# Patient Record
Sex: Male | Born: 2000 | Race: White | Hispanic: No | Marital: Single | Smoking: Never smoker
Health system: Southern US, Community
[De-identification: ages and names within clinical notes are randomized; demographics above are authoritative.]

## PROBLEM LIST (undated history)

## (undated) DIAGNOSIS — J45909 Unspecified asthma, uncomplicated: Secondary | ICD-10-CM

## (undated) HISTORY — DX: Unspecified asthma, uncomplicated: J45.909

---
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
## 2018-03-11 ENCOUNTER — Ambulatory Visit: Payer: Self-pay | Admitting: Family Medicine

## 2018-03-11 VITALS — BP 146/82 | Temp 98.0°F | Ht 71.0 in | Wt 288.0 lb

## 2018-03-11 DIAGNOSIS — T148XXA Other injury of unspecified body region, initial encounter: Secondary | ICD-10-CM

## 2018-03-11 DIAGNOSIS — S81812A Laceration without foreign body, left lower leg, initial encounter: Secondary | ICD-10-CM

## 2018-03-11 DIAGNOSIS — Z23 Encounter for immunization: Secondary | ICD-10-CM

## 2018-03-11 DIAGNOSIS — M79662 Pain in left lower leg: Secondary | ICD-10-CM

## 2018-03-11 NOTE — Progress Notes (Signed)
   Subjective:    Patient ID: Kenneth Campos, male    DOB: May 24, 2001, 17 y.o.   MRN: [id number]  HPI  Patient is here today with a cut he received yesterday afternoon done on a piece metal.Wrapped and cleaned with iodine.  Patient experienced shallow laceration of the leg.  Father patient elected not to take him to the emergency room butterfly displayed.  18 hours ago.  Photograph shows shallow laceration  Review of Systems No headache, no major weight loss or weight gain, no chest pain no back pain abdominal pain no change in bowel habits complete ROS otherwise negative     Objective:   Physical Exam   Alert vitals stable, NAD. Blood pressure good on repeat. HEENT normal. Lungs clear. Heart regular rate and rhythm. Anterior left leg laceration with Steri-Strips some surrounding hematoma leftover from old injury     Assessment & Plan:  Impression soft tissue wound plan wound care discussed see wrap-up patient instructions.  Tetanus shot rationale discussed

## 2018-03-11 NOTE — Patient Instructions (Signed)
lacereation to the leg that is this old (18 hrs ish the experts recommend not to put stitches in becsuse the risk of secondary infection and failure of the suture line is very high)  So, recommend leaving these butterfly strips on until they start to fray, try to keep dry, when start to fray recommend using rubing alcohol and replace for another few days, do this for ten days total to increase the chance of wound healing nicely  There is a chance this will open up at one point, if that does, tret like a ulcer on the leg, with twice daily cleaning, and triple antibiotic ointment or neopsorin, and , and it will eventually fill in with a little scarring                                                                      Finger to take also a Z-Pak with only for brevity sake start showing signs of infection to these 6 weeks

## 2018-10-17 ENCOUNTER — Other Ambulatory Visit: Payer: Self-pay

## 2018-10-17 ENCOUNTER — Emergency Department (HOSPITAL_COMMUNITY)
Admission: EM | Admit: 2018-10-17 | Discharge: 2018-10-17 | Disposition: A | Discharge status: Home / Self Care | Payer: Self-pay | Attending: Emergency Medicine | Admitting: Emergency Medicine

## 2018-10-17 ENCOUNTER — Encounter (HOSPITAL_COMMUNITY): Payer: Self-pay | Admitting: Emergency Medicine

## 2018-10-17 DIAGNOSIS — R69 Illness, unspecified: Secondary | ICD-10-CM

## 2018-10-17 DIAGNOSIS — Z79899 Other long term (current) drug therapy: Secondary | ICD-10-CM | POA: Insufficient documentation

## 2018-10-17 DIAGNOSIS — J45909 Unspecified asthma, uncomplicated: Secondary | ICD-10-CM | POA: Insufficient documentation

## 2018-10-17 DIAGNOSIS — J111 Influenza due to unidentified influenza virus with other respiratory manifestations: Secondary | ICD-10-CM | POA: Insufficient documentation

## 2018-10-17 MED ORDER — ACETAMINOPHEN 500 MG PO TABS
1000.0000 mg | ORAL_TABLET | Freq: Once | ORAL | Status: AC
  Administered 2018-10-17: 1000 mg via ORAL
  Filled 2018-10-17: qty 2

## 2018-10-17 MED ORDER — ONDANSETRON HCL 4 MG PO TABS
4.0000 mg | ORAL_TABLET | Freq: Once | ORAL | Status: AC
  Administered 2018-10-17: 4 mg via ORAL
  Filled 2018-10-17: qty 1

## 2018-10-17 MED ORDER — ONDANSETRON HCL 4 MG PO TABS: 4.0000 mg | ORAL_TABLET | Freq: Four times a day (QID) | ORAL | 0 refills | Status: DC

## 2018-10-17 NOTE — ED Triage Notes (Signed)
Flu like sx   No vaccination   Here for eval

## 2018-10-17 NOTE — ED Provider Notes (Signed)
[facility] EMERGENCY DEPARTMENT Provider Note   CSN: [id number] Arrival date & time: 10/17/18  1459    History   Chief Complaint Chief Complaint  Patient presents with  . Influenza    HPI Kenneth Campos is a 18 y.o. male.     The history is provided by the patient.  Influenza  Presenting symptoms: cough, fatigue, fever, myalgias and vomiting   Presenting symptoms: no shortness of breath   Severity:  Moderate Duration:  2 days Progression:  Worsening Chronicity:  New Relieved by:  Nothing Worsened by:  Nothing Ineffective treatments:  OTC medications Associated symptoms: decreased appetite and nasal congestion   Risk factors: sick contacts     Past Medical History:  Diagnosis Date  . Asthma     There are no active problems to display for this patient.   History reviewed. No pertinent surgical history.      Home Medications    Prior to Admission medications   Medication Sig Start Date End Date Taking? Authorizing Provider  cephALEXin (KEFLEX) 500 MG capsule Take 1 capsule (500 mg total) by mouth 3 (three) times daily. Patient not taking: Reported on 03/11/2018 11/19/16   Merlyn Albert, MD    Family History No family history on file.  Social History Social History   Tobacco Use  . Smoking status: Never Smoker  . Smokeless tobacco: Never Used  Substance Use Topics  . Alcohol use: Not Currently  . Drug use: Never     Allergies   Patient has no known allergies.   Review of Systems Review of Systems  Constitutional: Positive for decreased appetite, fatigue and fever. Negative for activity change.       All ROS Neg except as noted in HPI  HENT: Positive for congestion. Negative for nosebleeds.   Eyes: Negative for photophobia and discharge.  Respiratory: Positive for cough. Negative for shortness of breath and wheezing.   Cardiovascular: Negative for chest pain and palpitations.  Gastrointestinal: Positive for vomiting. Negative for abdominal  pain and blood in stool.  Genitourinary: Negative for dysuria, frequency and hematuria.  Musculoskeletal: Positive for myalgias. Negative for arthralgias, back pain and neck pain.  Skin: Negative.   Neurological: Negative for dizziness, seizures and speech difficulty.  Psychiatric/Behavioral: Negative for confusion and hallucinations.     Physical Exam Updated Vital Signs BP (!) 142/83 (BP Location: Right Arm)   Pulse 99   Temp 98.5 F (36.9 C) (Oral)   Resp 16   Ht 6' (1.829 m)   Wt 117.9 kg   SpO2 99%   BMI 35.26 kg/m   Physical Exam Vitals signs and nursing note reviewed.  Constitutional:      Appearance: He is well-developed. He is not toxic-appearing.  HENT:     Head: Normocephalic.     Right Ear: Tympanic membrane and external ear normal.     Left Ear: Tympanic membrane and external ear normal.     Nose: Congestion present.  Eyes:     General: Lids are normal.     Pupils: Pupils are equal, round, and reactive to light.  Neck:     Musculoskeletal: Normal range of motion and neck supple.     Vascular: No carotid bruit.  Cardiovascular:     Rate and Rhythm: Normal rate and regular rhythm.     Pulses: Normal pulses.     Heart sounds: Normal heart sounds.  Pulmonary:     Effort: No respiratory distress.     Breath sounds: Normal  breath sounds.  Abdominal:     General: Bowel sounds are normal.     Palpations: Abdomen is soft.     Tenderness: There is no abdominal tenderness. There is no guarding.  Musculoskeletal: Normal range of motion.  Lymphadenopathy:     Head:     Right side of head: No submandibular adenopathy.     Left side of head: No submandibular adenopathy.     Cervical: No cervical adenopathy.  Skin:    General: Skin is warm and dry.  Neurological:     Mental Status: He is alert and oriented to person, place, and time.     Cranial Nerves: No cranial nerve deficit.     Sensory: No sensory deficit.  Psychiatric:        Speech: Speech normal.       ED Treatments / Results  Labs (all labs ordered are listed, but only abnormal results are displayed) Labs Reviewed - No data to display  EKG None  Radiology No results found.  Procedures Procedures (including critical care time)  Medications Ordered in ED Medications - No data to display   Initial Impression / Assessment and Plan / ED Course  I have reviewed the triage vital signs and the nursing notes.  Pertinent labs & imaging results that were available during my care of the patient were reviewed by me and considered in my medical decision making (see chart for details).          Final Clinical Impressions(s) / ED Diagnoses MDM  Vital signs reviewed.  Pulse oximetry is 99% on room air.  Within normal limits by my interpretation.  The examination favor influenza-like illness.  The patient states he has not been traveling on commercial transportation recently.  He has not been out of the country recently.  A mask is been provided for the patient.  The patient is to increase fluids.  He will use Tylenol every 4 hours or ibuprofen every 6 hours for fever, and/or aching.  The patient is given a prescription for Zofran for nausea.  He is to see the primary physician or return to the emergency department if any changes in his condition, worsening of symptoms, problems, or concerns.   Final diagnoses:  Influenza-like illness    ED Discharge Orders         Ordered    ondansetron (ZOFRAN) 4 MG tablet  Every 6 hours     10/17/18 1623           Ivery Quale, PA-C 10/17/18 1628    Samuel Jester, DO 10/17/18 2318

## 2018-10-17 NOTE — Discharge Instructions (Addendum)
Your blood pressure slightly elevated, otherwise your vital signs are within normal limits.  Your oxygen level is 99% on room air, which is within normal limits.  Your examination favors an influenza-like illness.  Please use Tylenol every 4 hours or ibuprofen every 6 hours for fever, aching, or chills.  Please use Zofran every 6 hours for nausea and vomiting.  It is important that you wash your hands frequently.  Please have everyone in your home wash hands frequently.  Please wipe off surfaces as needed.  Please increase fluids.  See Dr. Gerda Diss or return to the emergency department if any changes in your condition, worsening of your symptoms, problems, or concerns.

## 2019-06-05 ENCOUNTER — Other Ambulatory Visit: Payer: Self-pay

## 2019-06-05 DIAGNOSIS — Z20822 Contact with and (suspected) exposure to covid-19: Secondary | ICD-10-CM

## 2019-06-06 LAB — NOVEL CORONAVIRUS, NAA: SARS-CoV-2, NAA: NOT DETECTED

## 2019-06-11 ENCOUNTER — Telehealth: Payer: Self-pay | Admitting: *Deleted

## 2019-06-11 NOTE — Telephone Encounter (Signed)
He called in for COVID-19 test result.   I let him know it was not detected meaning he did not have the virus.

## 2019-08-03 ENCOUNTER — Other Ambulatory Visit: Payer: Self-pay

## 2019-08-03 ENCOUNTER — Ambulatory Visit: Payer: HRSA Program | Attending: Internal Medicine

## 2019-08-03 DIAGNOSIS — Z20822 Contact with and (suspected) exposure to covid-19: Secondary | ICD-10-CM

## 2019-08-03 DIAGNOSIS — Z20828 Contact with and (suspected) exposure to other viral communicable diseases: Secondary | ICD-10-CM | POA: Diagnosis present

## 2019-08-04 ENCOUNTER — Telehealth: Payer: Self-pay

## 2019-08-04 NOTE — Telephone Encounter (Signed)
Received call from patient checking Covid results.  Advised no results at this time.   

## 2019-08-05 ENCOUNTER — Telehealth: Payer: Self-pay | Admitting: Family Medicine

## 2019-08-05 LAB — NOVEL CORONAVIRUS, NAA: SARS-CoV-2, NAA: NOT DETECTED

## 2019-08-05 NOTE — Telephone Encounter (Signed)
Negative COVID results given. Patient results "NOT Detected." Caller expressed understanding. ° °

## 2019-08-11 ENCOUNTER — Other Ambulatory Visit: Payer: Self-pay

## 2019-08-11 ENCOUNTER — Ambulatory Visit: Payer: Self-pay | Attending: Internal Medicine

## 2019-08-11 DIAGNOSIS — Z20822 Contact with and (suspected) exposure to covid-19: Secondary | ICD-10-CM | POA: Insufficient documentation

## 2019-08-13 ENCOUNTER — Telehealth: Payer: Self-pay | Admitting: *Deleted

## 2019-08-13 LAB — NOVEL CORONAVIRUS, NAA: SARS-CoV-2, NAA: NOT DETECTED

## 2019-08-13 NOTE — Telephone Encounter (Signed)
Pt advised that his covid 19 test result is negative from Jan  5 th. He voiced understanding.

## 2019-09-02 ENCOUNTER — Encounter: Payer: Self-pay | Admitting: Family Medicine

## 2019-09-03 ENCOUNTER — Encounter: Payer: Self-pay | Admitting: Family Medicine

## 2020-08-11 ENCOUNTER — Other Ambulatory Visit: Payer: Self-pay

## 2020-08-11 DIAGNOSIS — Z20822 Contact with and (suspected) exposure to covid-19: Secondary | ICD-10-CM

## 2020-08-15 LAB — NOVEL CORONAVIRUS, NAA: SARS-CoV-2, NAA: NOT DETECTED

## 2020-08-15 LAB — SPECIMEN STATUS REPORT

## 2023-04-03 ENCOUNTER — Ambulatory Visit (INDEPENDENT_AMBULATORY_CARE_PROVIDER_SITE_OTHER): Payer: Self-pay | Admitting: Orthopedic Surgery

## 2023-04-03 ENCOUNTER — Encounter: Payer: Self-pay | Admitting: Orthopedic Surgery

## 2023-04-03 VITALS — BP 149/85 | HR 96 | Ht 73.0 in | Wt 274.0 lb

## 2023-04-03 DIAGNOSIS — D169 Benign neoplasm of bone and articular cartilage, unspecified: Secondary | ICD-10-CM

## 2023-04-03 DIAGNOSIS — S82831A Other fracture of upper and lower end of right fibula, initial encounter for closed fracture: Secondary | ICD-10-CM

## 2023-04-03 DIAGNOSIS — R202 Paresthesia of skin: Secondary | ICD-10-CM

## 2023-04-03 DIAGNOSIS — M25561 Pain in right knee: Secondary | ICD-10-CM

## 2023-04-03 DIAGNOSIS — M25461 Effusion, right knee: Secondary | ICD-10-CM

## 2023-04-03 MED ORDER — IBUPROFEN 800 MG PO TABS: 800.0000 mg | ORAL_TABLET | Freq: Three times a day (TID) | ORAL | 1 refills | Status: AC | PRN

## 2023-04-03 NOTE — Patient Instructions (Signed)
For MRI please go ahead and call to schedule your appointment with [facility] within at least one (1) week.   Central Scheduling [phone]
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
## 2023-04-11 ENCOUNTER — Ambulatory Visit (HOSPITAL_COMMUNITY)
Admission: RE | Admit: 2023-04-11 | Discharge: 2023-04-11 | Disposition: A | Discharge status: Home / Self Care | Payer: Self-pay | Source: Ambulatory Visit | Attending: Orthopedic Surgery | Admitting: Orthopedic Surgery

## 2023-04-11 DIAGNOSIS — M25561 Pain in right knee: Secondary | ICD-10-CM | POA: Insufficient documentation

## 2023-04-29 ENCOUNTER — Encounter: Payer: Self-pay | Admitting: Orthopedic Surgery

## 2023-04-29 ENCOUNTER — Other Ambulatory Visit (INDEPENDENT_AMBULATORY_CARE_PROVIDER_SITE_OTHER): Payer: Self-pay

## 2023-04-29 ENCOUNTER — Telehealth: Payer: Self-pay | Admitting: Orthopedic Surgery

## 2023-04-29 ENCOUNTER — Ambulatory Visit (INDEPENDENT_AMBULATORY_CARE_PROVIDER_SITE_OTHER): Payer: Self-pay | Admitting: Orthopedic Surgery

## 2023-04-29 VITALS — BP 135/79 | HR 84 | Ht 73.0 in | Wt 279.0 lb

## 2023-04-29 DIAGNOSIS — G8929 Other chronic pain: Secondary | ICD-10-CM

## 2023-04-29 DIAGNOSIS — M25561 Pain in right knee: Secondary | ICD-10-CM

## 2023-04-29 DIAGNOSIS — M25861 Other specified joint disorders, right knee: Secondary | ICD-10-CM

## 2023-04-29 NOTE — Telephone Encounter (Signed)
The study was done on 04/11/23 it resulted today  MPRESSION: 1. 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 This is new from 03/02/2016 radiographs. 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 Unfortunately, no recent radiographs are available for comparison, and this was not present on 03/02/2016 left knee radiographs. Recommend correlation with current radiographs. As this process is incompletely visualized on the current MRI, consider complete visualization on radiographs and possible biopsy/resection of this large posterior proximal calf ossicle for further diagnosis. 2. There is moderate patchy marrow edema throughout the posterior proximal tibial epiphysis and physis, presumably stress related/reactive from impingement/abutment with the large posterior proximal calf ossicle. 3. There is moderate to high-grade marrow edema within the proximal fibula. There is subtle curvilinear decreased T1 and decreased T2 signal within the proximal fibular styloid that could represent a tiny acute to subacute fracture, however otherwise no distinct fracture line is seen. 4. There is a vertically oriented tear extending through the superior and inferior articular surfaces of the middle third of meniscal triangle of the anterior horn of the lateral meniscus. 5. There is moderate elongation of the inferior patellar pole at the patellar tendon origin, likely from chronic traction changes. There is increased T2 signal  within the midsubstance of the proximal patellar origin at the far inferior aspect of the patella, likely focal tendinosis. 6. Mild to moderate edema within the superolateral aspect of Hoffa's fat pad as can be seen with infrapatellar fat pad impingement.   These results will be called to the ordering clinician or representative by the Radiologist Assistant, and communication documented in the PACS or [employer].     Electronically Signed

## 2023-04-29 NOTE — Telephone Encounter (Signed)
Dr. Mort Sawyers pt - [facility] [phone] lvm stating they have a call report for the MRI of the right knee.  They would like a call back asap.  She stated anyone that answers can give the results and even if you already have the report, they need a call back so they can document that the results were given.
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
## 2023-04-29 NOTE — Patient Instructions (Signed)
I will send your referral to [facility]. You can call them to schedule the number is [phone]. They will need you to get a CD of the xray images from [facility]. The number to call for the CD is [phone] ask for [facility] Fax number is [phone] and the doctors are Dr Jana Half and Dr Andrey Campanile
